# Patient Record
Sex: Female | Born: 1990 | Race: White | Hispanic: No | Marital: Married
Health system: Southern US, Community
[De-identification: ages and names within clinical notes are randomized; demographics above are authoritative.]

---

## 2012-04-05 ENCOUNTER — Emergency Department: Payer: Self-pay | Admitting: *Deleted

## 2012-04-05 LAB — URINALYSIS, COMPLETE
Bilirubin,UR: NEGATIVE
Blood: NEGATIVE
Hyaline Cast: 2
Nitrite: NEGATIVE
Protein: NEGATIVE

## 2012-04-06 LAB — HCG, QUANTITATIVE, PREGNANCY: Beta Hcg, Quant.: 30731 m[IU]/mL — ABNORMAL HIGH

## 2012-07-07 ENCOUNTER — Encounter: Payer: Self-pay | Admitting: Maternal & Fetal Medicine

## 2012-08-07 ENCOUNTER — Observation Stay: Payer: Self-pay | Admitting: Obstetrics and Gynecology

## 2012-08-07 LAB — URINALYSIS, COMPLETE
Blood: NEGATIVE
Glucose,UR: NEGATIVE mg/dL (ref 0–75)
Ketone: NEGATIVE
Nitrite: NEGATIVE
Ph: 7 (ref 4.5–8.0)
WBC UR: 12 /HPF (ref 0–5)

## 2012-09-01 ENCOUNTER — Encounter: Payer: Self-pay | Admitting: Maternal and Fetal Medicine

## 2012-09-24 ENCOUNTER — Observation Stay: Payer: Self-pay

## 2012-09-24 LAB — URINALYSIS, COMPLETE
Glucose,UR: NEGATIVE mg/dL (ref 0–75)
Ketone: NEGATIVE
Protein: 30
RBC,UR: 9 /HPF (ref 0–5)
Specific Gravity: 1.018 (ref 1.003–1.030)
Squamous Epithelial: 29
WBC UR: 252 /HPF (ref 0–5)

## 2012-10-23 ENCOUNTER — Encounter: Payer: Self-pay | Admitting: Maternal & Fetal Medicine

## 2014-02-04 IMAGING — US US OB < 14 WEEKS
1 series · 14 of 28 positions shown · non-contrast
Comparison: none

REASON FOR EXAM: Abdominal pain
COMMENTS:   May transport without cardiac monitor

[Series 1: us ob < 14 weeks · 0.18mm/px · 14 of 38 slices shown]
[im 2/38]
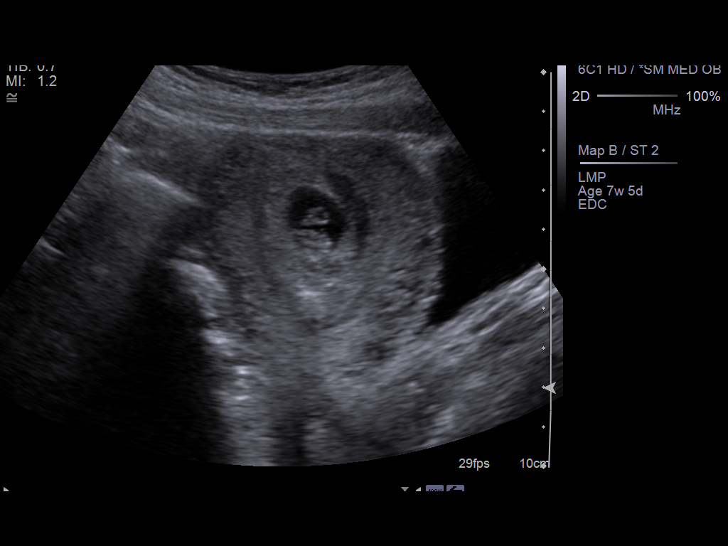
[im 5/38]
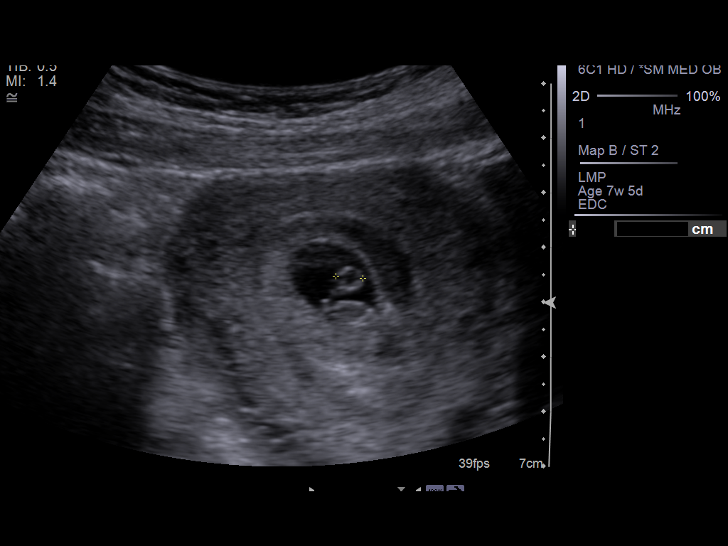
[im 7/38]
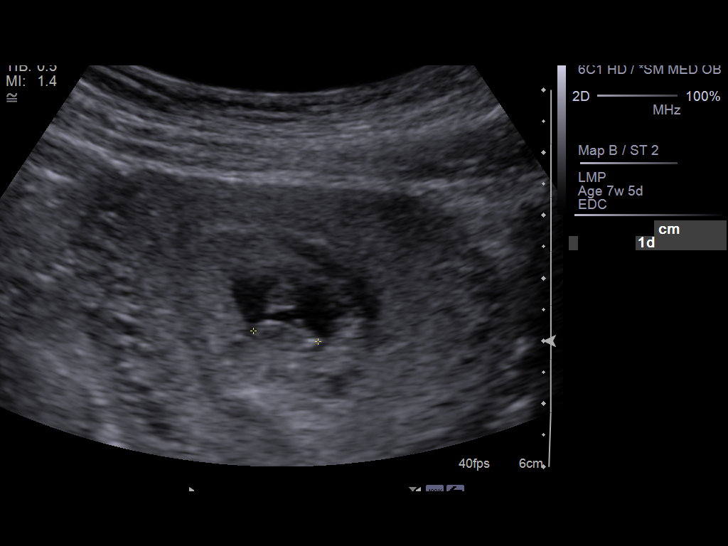
[im 10/38]
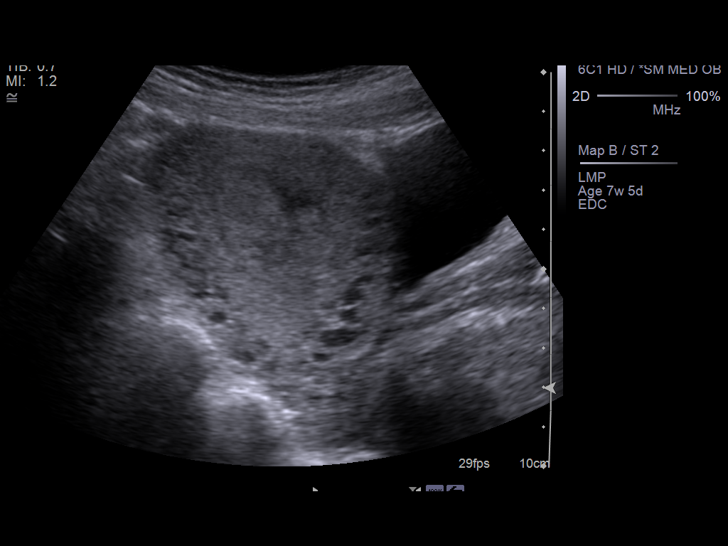
[im 13/38]
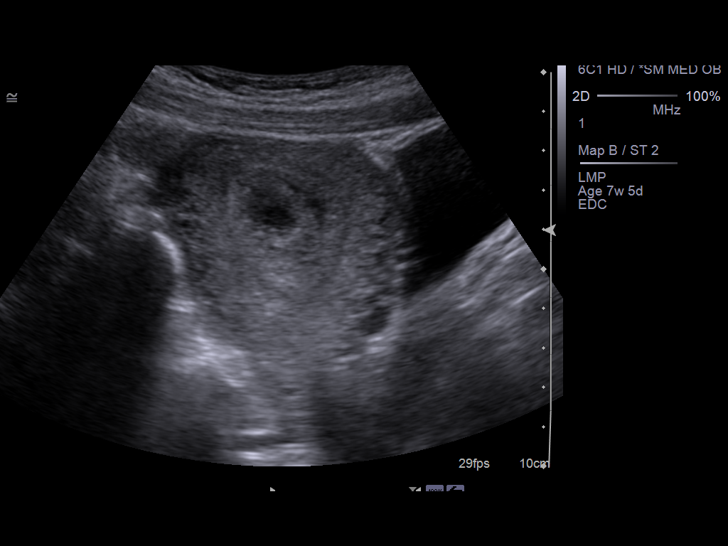
[im 16/38]
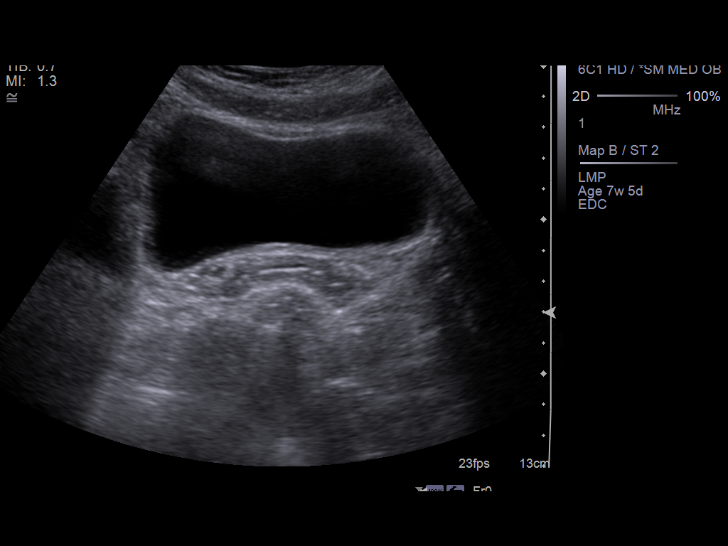
[im 18/38]
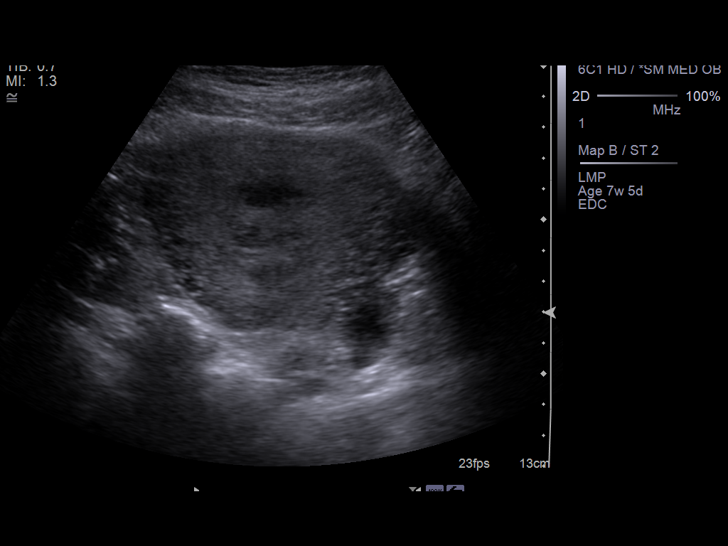
[im 21/38]
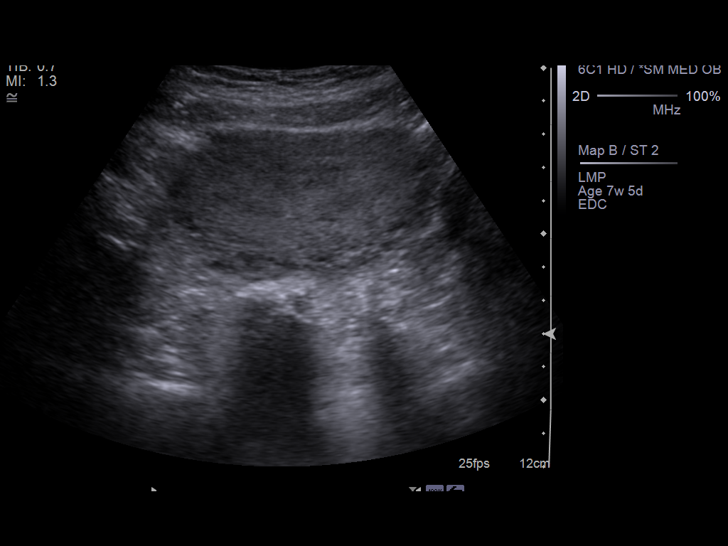
[im 24/38]
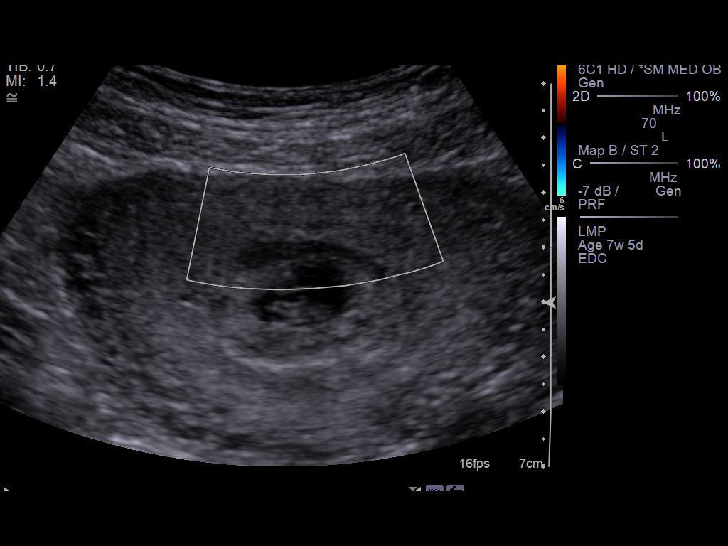
[im 27/38]
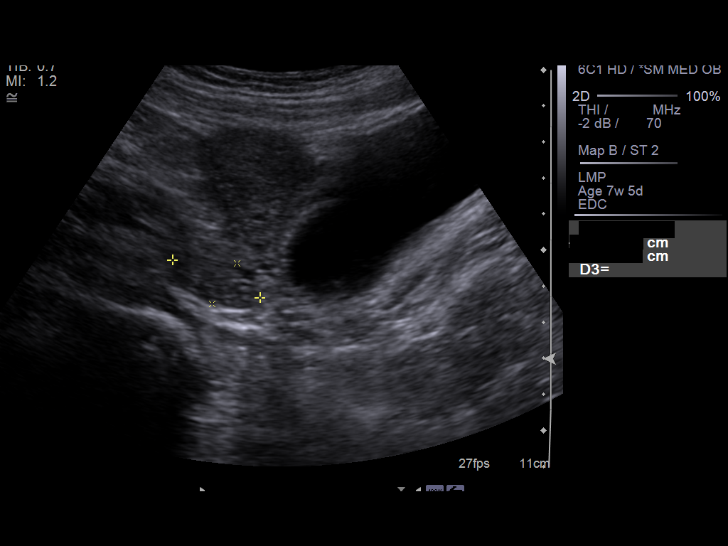
[im 29/38]
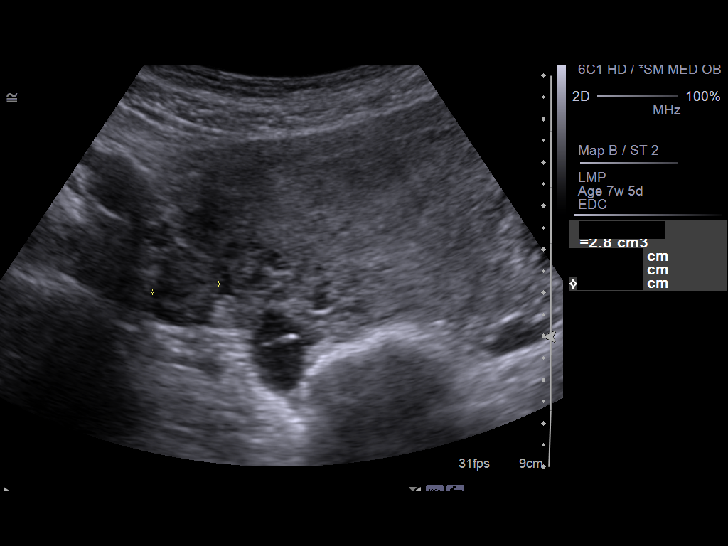
[im 32/38]
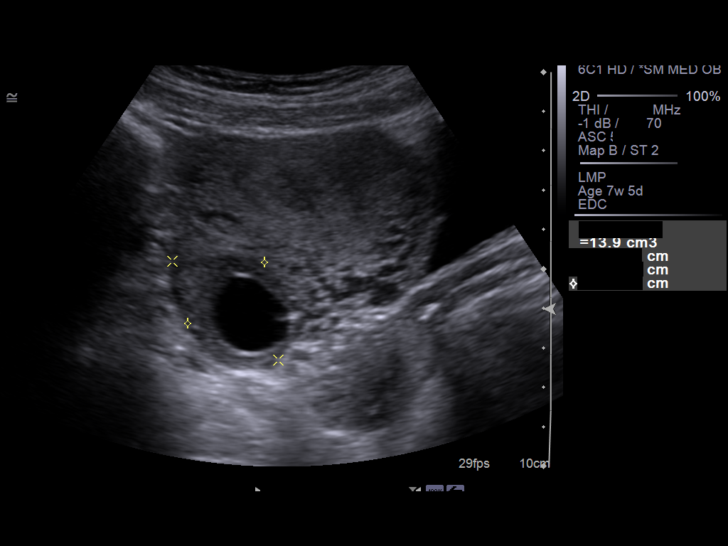
[im 35/38]
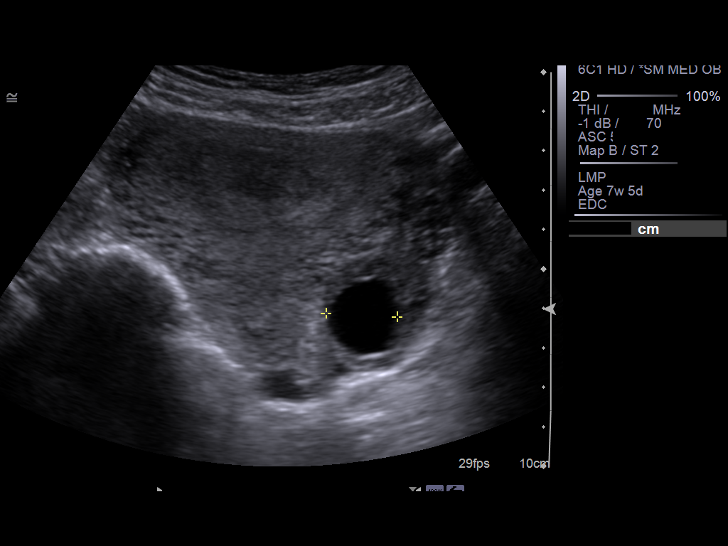
[im 38/38]
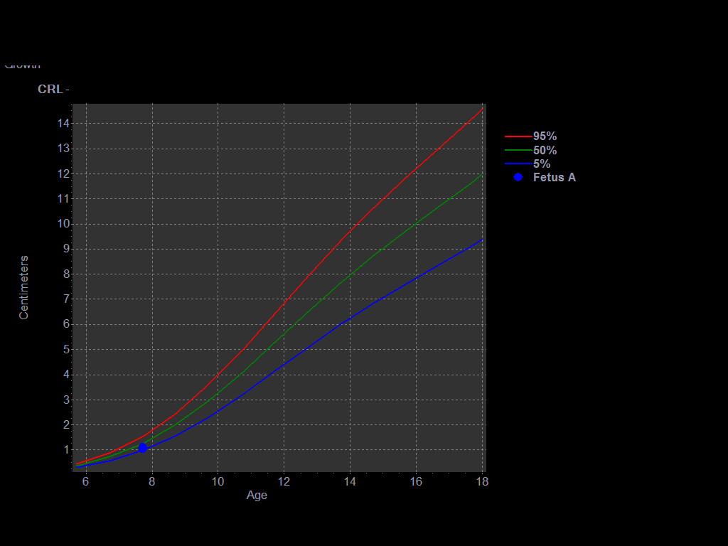

[14 of 28 positions shown; findings below may reference images not displayed]

PROCEDURE:     US  - US OB LESS THAN 14 WEEKS  - April 06, 2012  [DATE]

RESULT:     There is a gravid uterus present. The crown-rump length of the
fetus measures 1.05 cm corresponding to a 7 week one day gestation. A yolk
sac measuring 5 mm in diameter is present. A cardiac rate of 153 beats per
minute is demonstrated. There is a very small subchorionic correction there
is a small subchorionic hemorrhage measuring 2.1 x 2.9 x 0.8 cm.

The maternal ovaries exhibit normal echotexture. The right ovary measures
2.6 x 1.5 x 1.3 cm. The left ovary measures 2.7 x 2.9 x 2.5 cm. The left
ovary does contain a 2.2 cm diameter cyst. Vascularity of the ovaries is
normal.
IMPRESSION: 1. There is an early IUP with estimated gestational age of 7 weeks one day.
A fetal cardiac rate of 153 beats permitted is demonstrated.
2. There is a subchorionic hemorrhage measuring 2.7 x 2.9 x 0.8 cm in
greatest dimension.
3. There is a 2.2 cm diameter left ovarian cyst.

A preliminary report was sent to the [HOSPITAL] the conclusion
of the study.

[REDACTED]

## 2014-05-07 IMAGING — US US OB DETAIL+14 WK - NRPT [facility]
1 series · 14 of 28 positions shown · non-contrast
Comparison: none

[Series 1: us ob detail+14 wk - [facility]pt [facility] · 0.26mm/px · 14 of 123 slices shown]
[im 5/123]
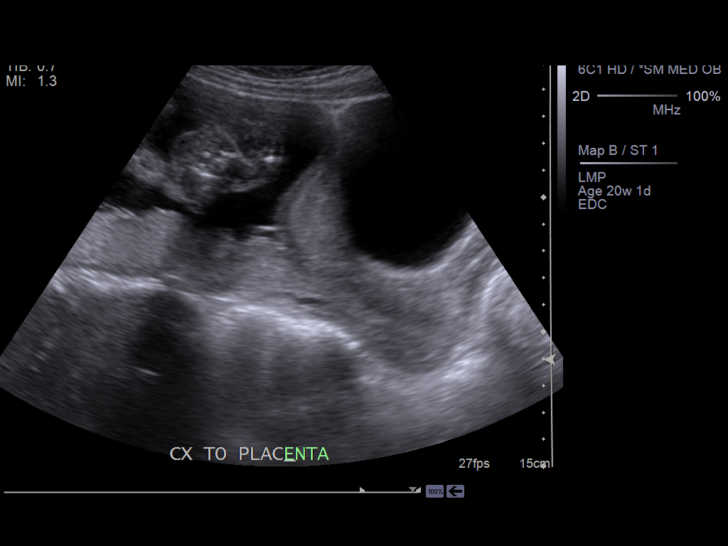
[im 14/123]
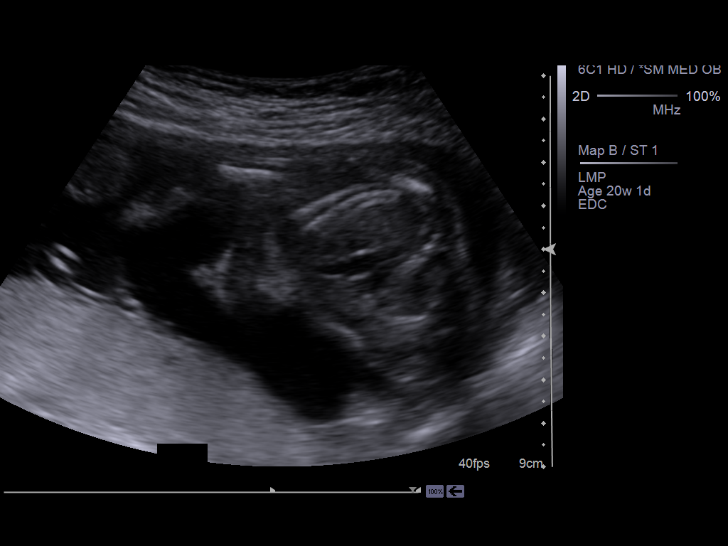
[im 23/123]
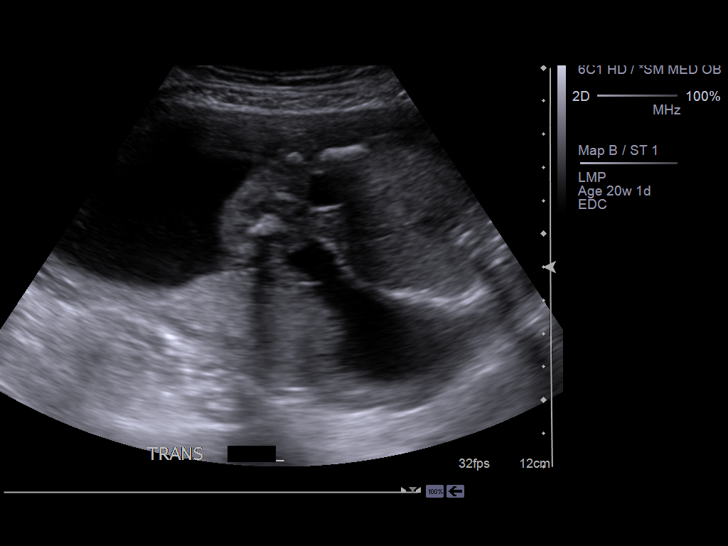
[im 32/123]
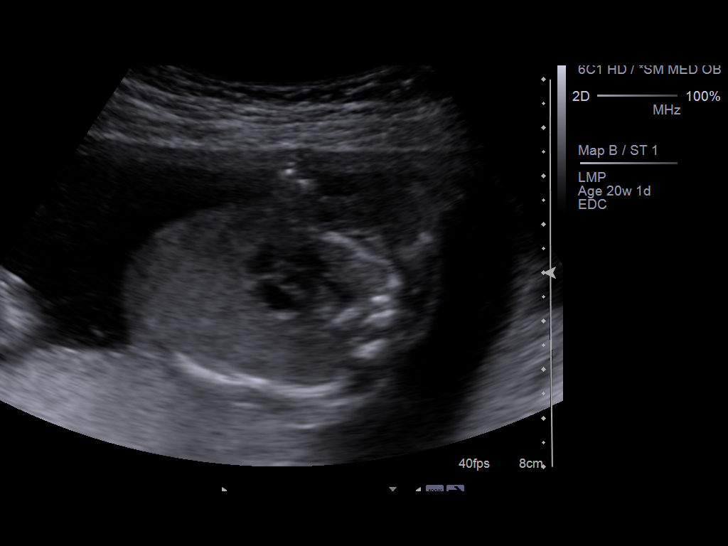
[im 41/123]
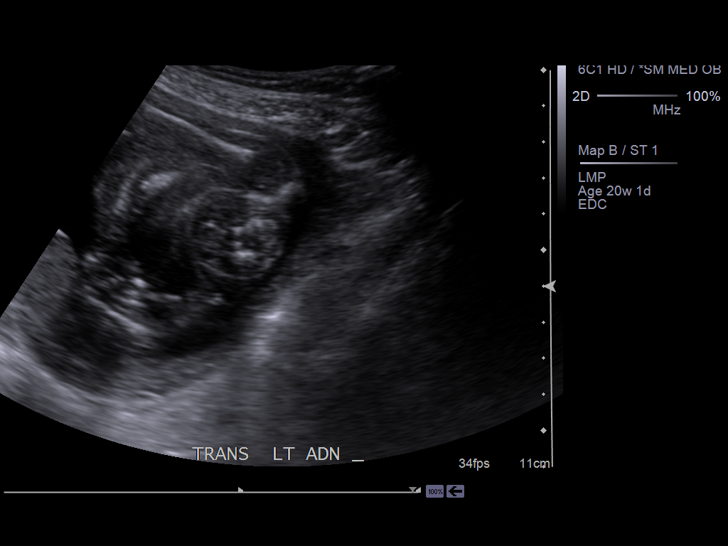
[im 50/123]
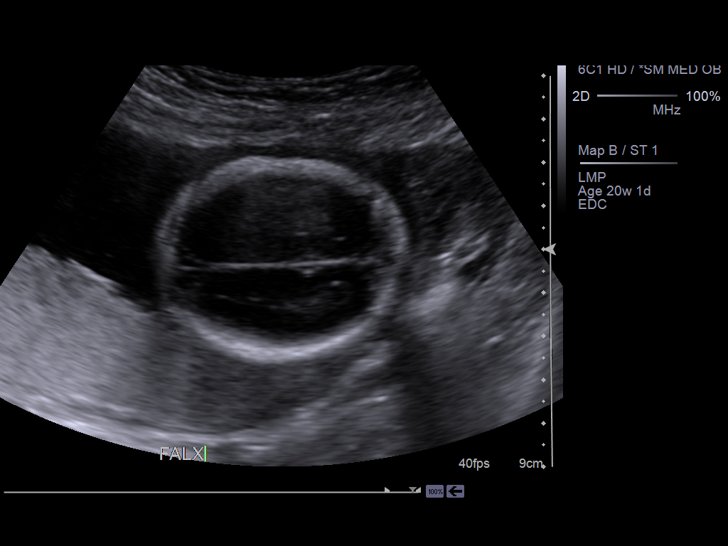
[im 59/123]
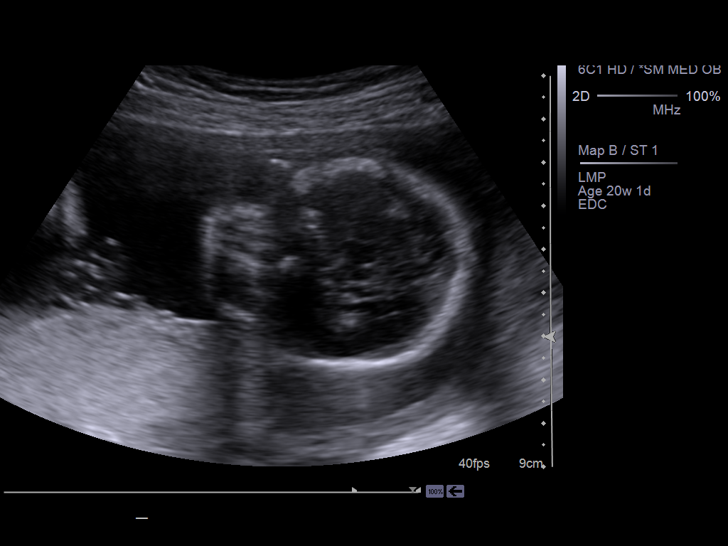
[im 68/123]
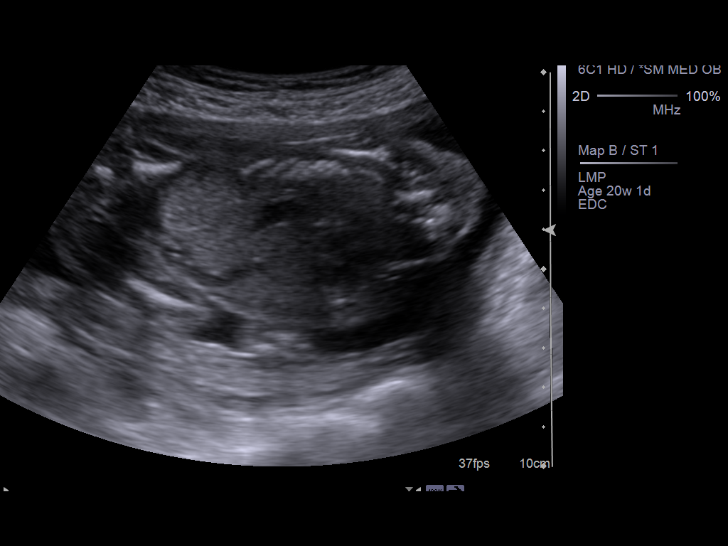
[im 77/123]
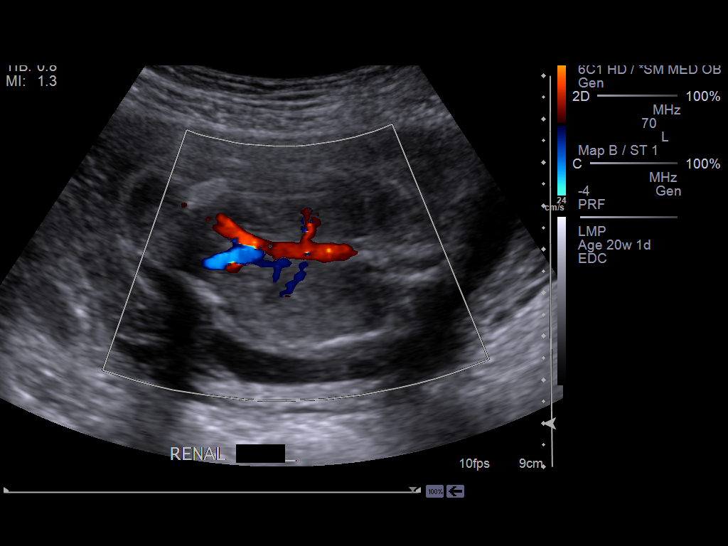
[im 86/123]
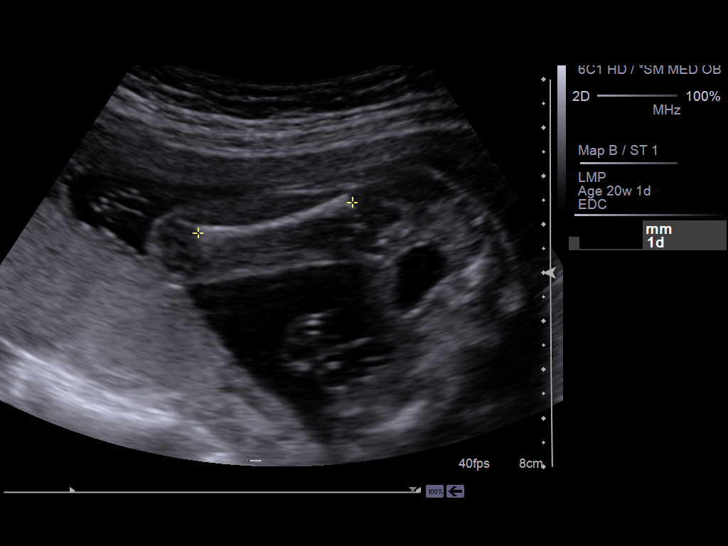
[im 95/123]
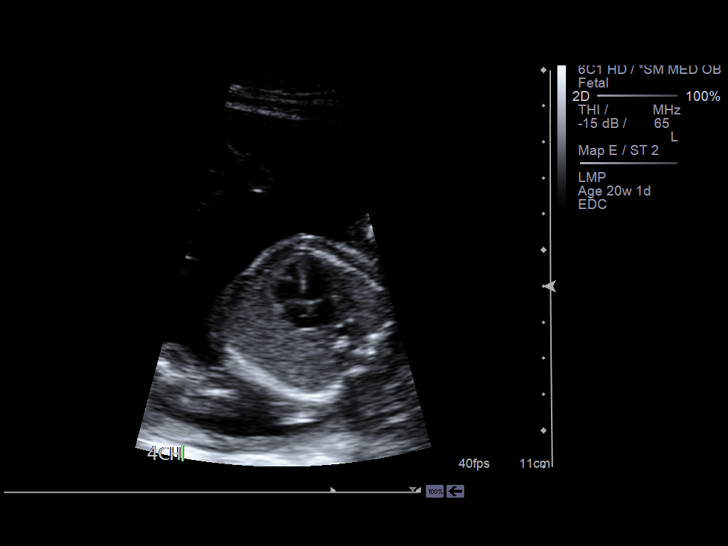
[im 104/123]
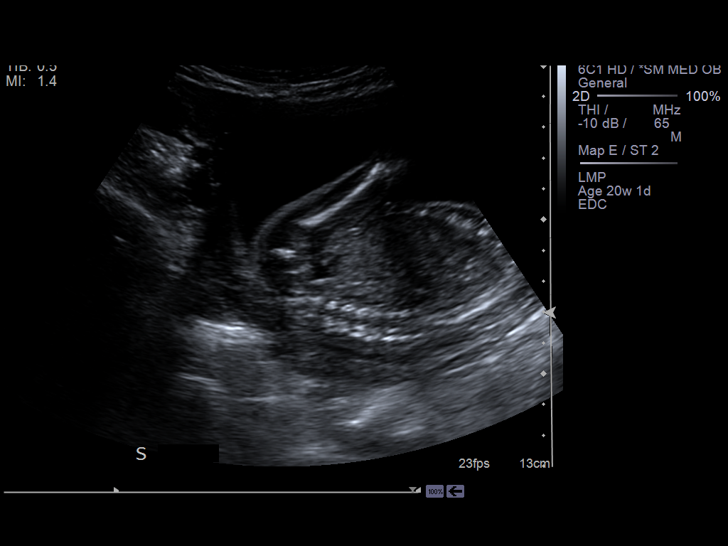
[im 113/123]
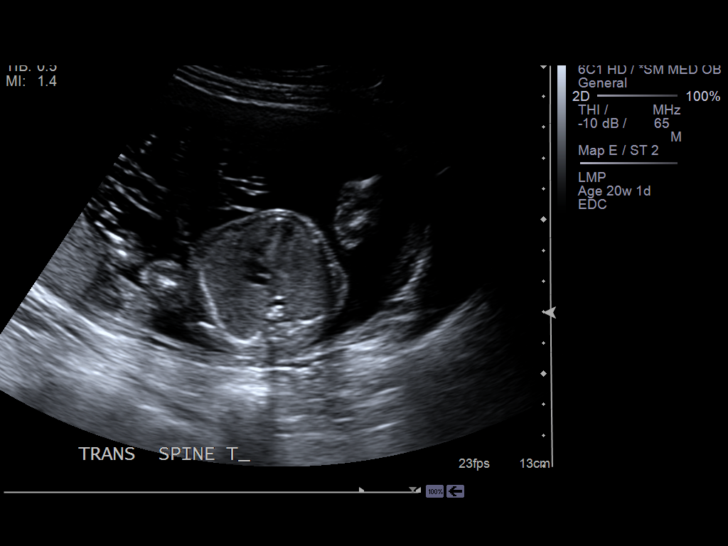
[im 123/123]
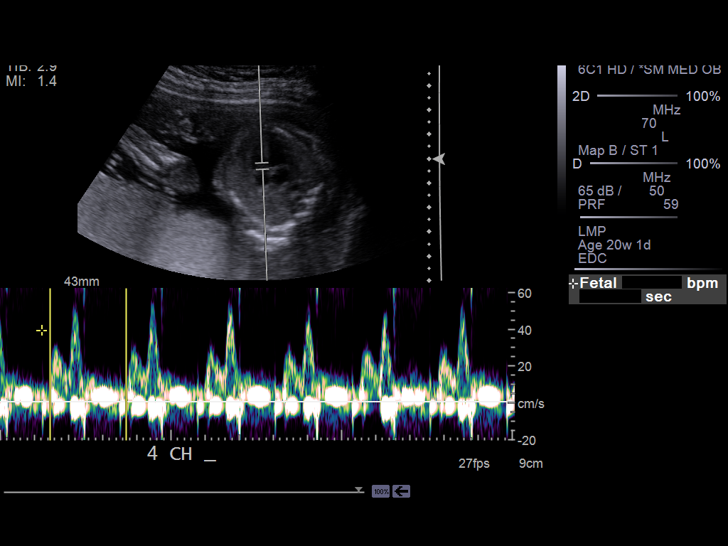

[14 of 28 positions shown; findings below may reference images not displayed]

IMAGES IMPORTED FROM THE [employer] WORKFLOW SYSTEM
NO DICTATION FOR STUDY

## 2014-08-23 IMAGING — US US OB FOLLOW-UP - [facility]
1 series · 14 of 28 positions shown · non-contrast
Comparison: none

[Series 1: us ob follow-up - [facility] · 0.26mm/px · 14 of 57 slices shown]
[im 3/57]
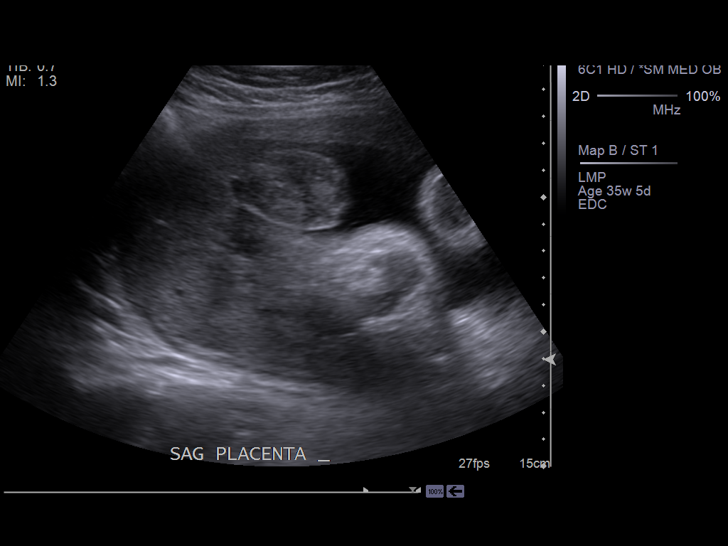
[im 7/57]
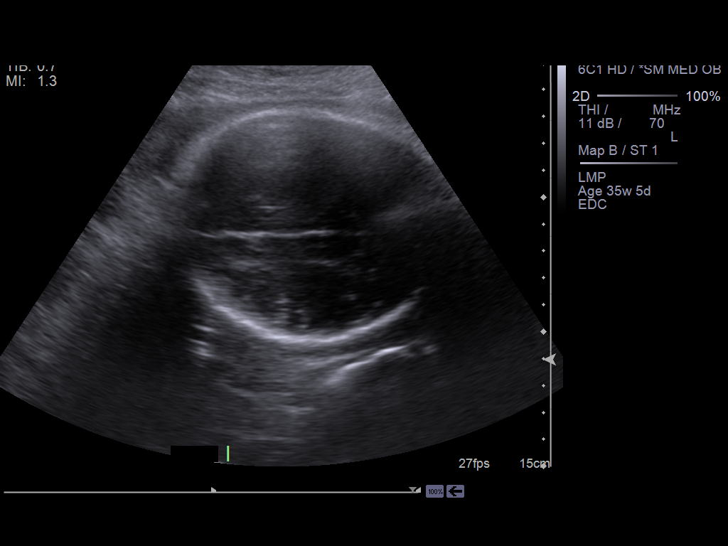
[im 11/57]
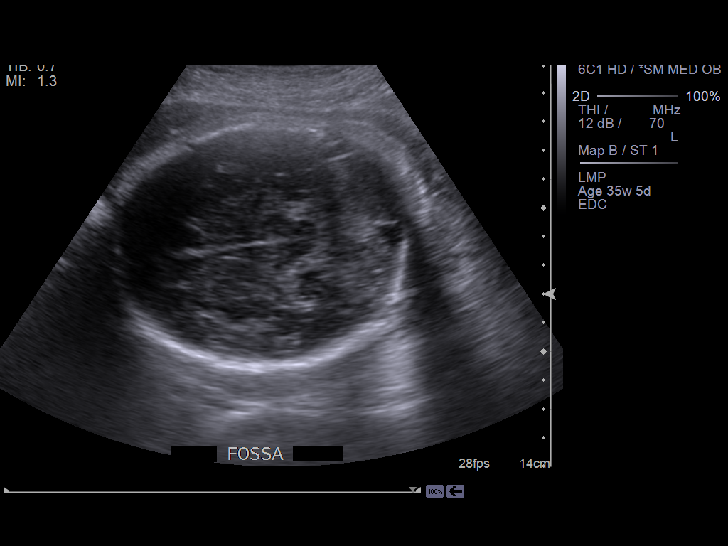
[im 15/57]
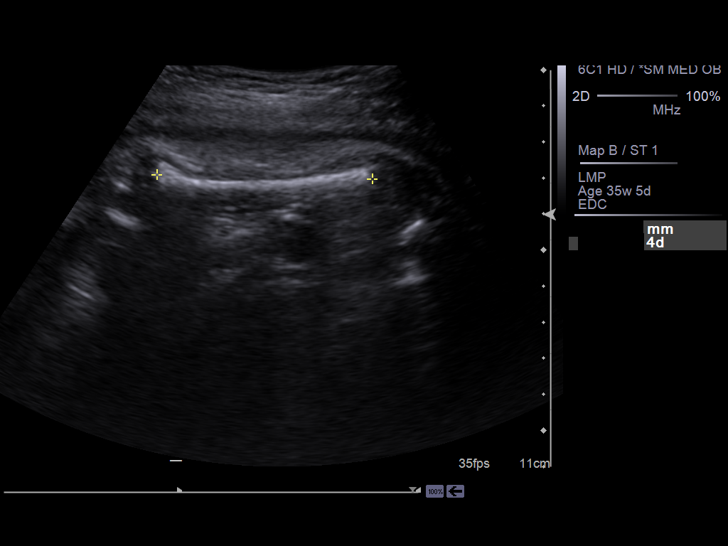
[im 19/57]
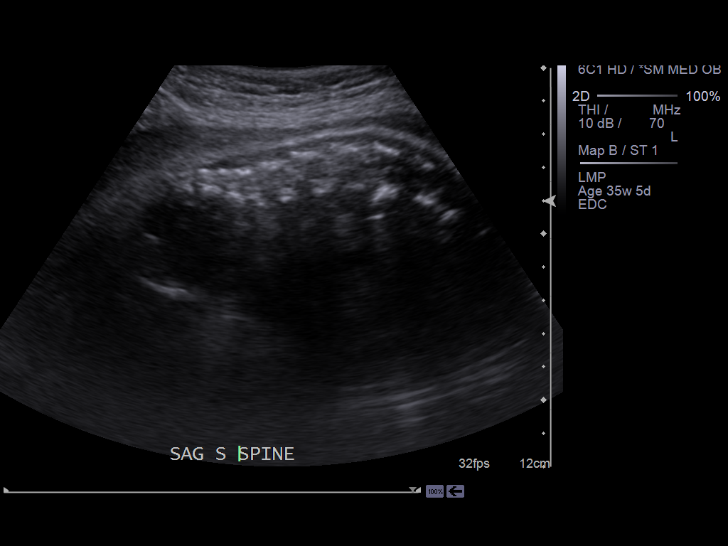
[im 23/57]
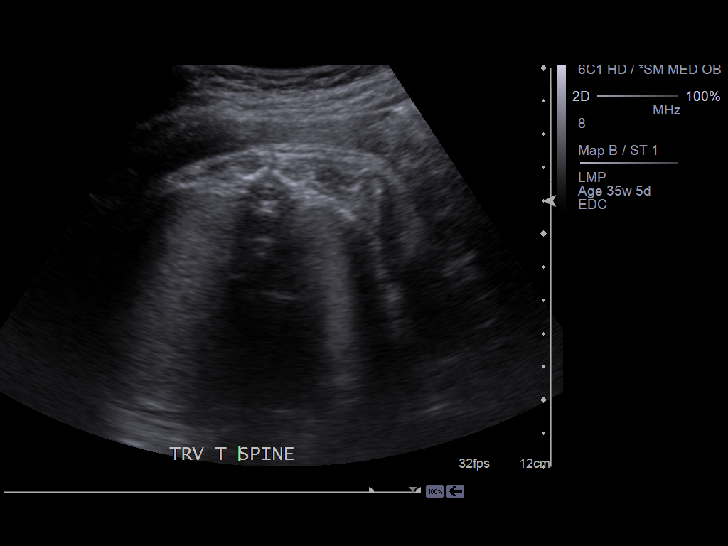
[im 27/57]
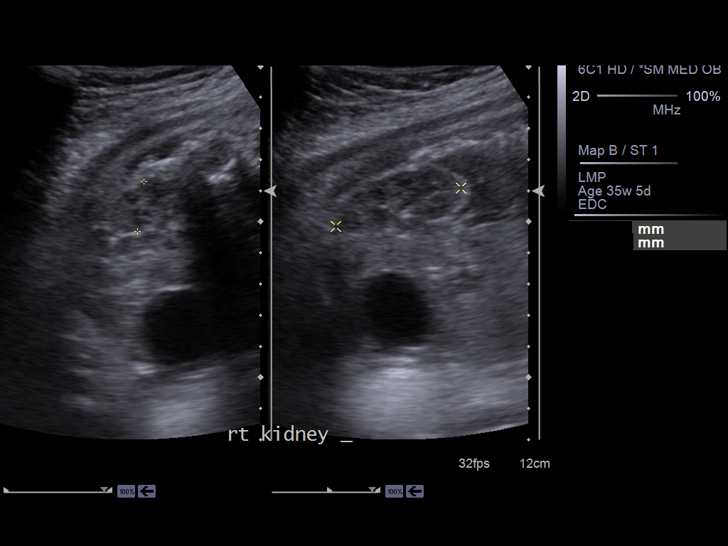
[im 32/57]
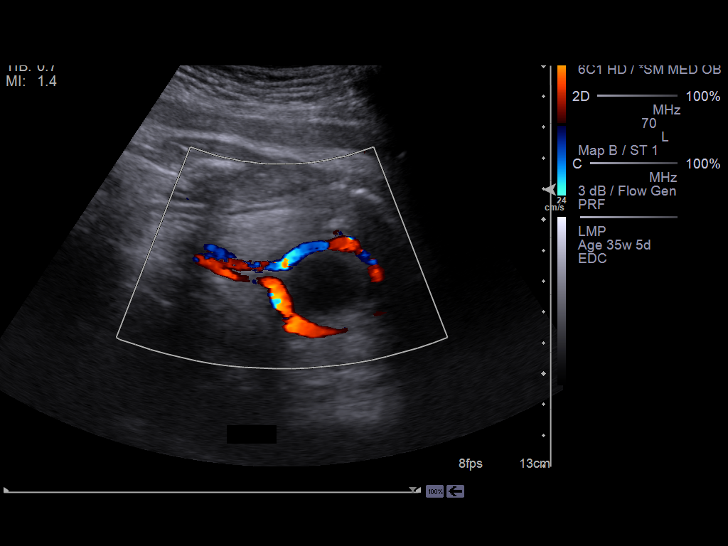
[im 36/57]
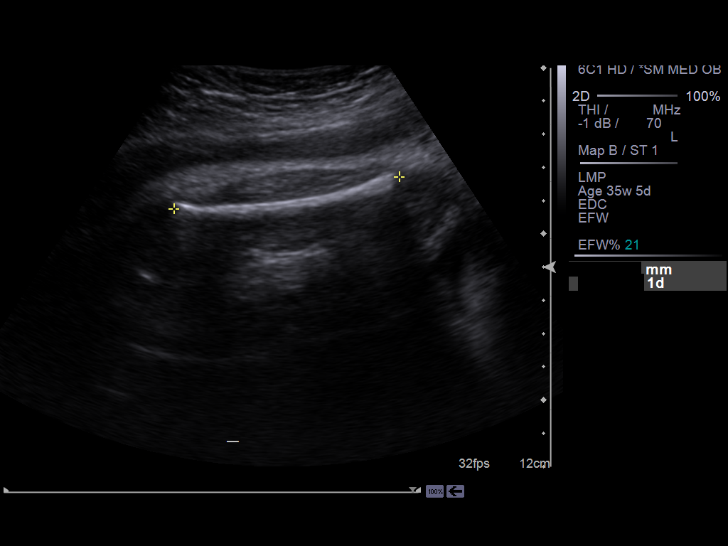
[im 40/57]
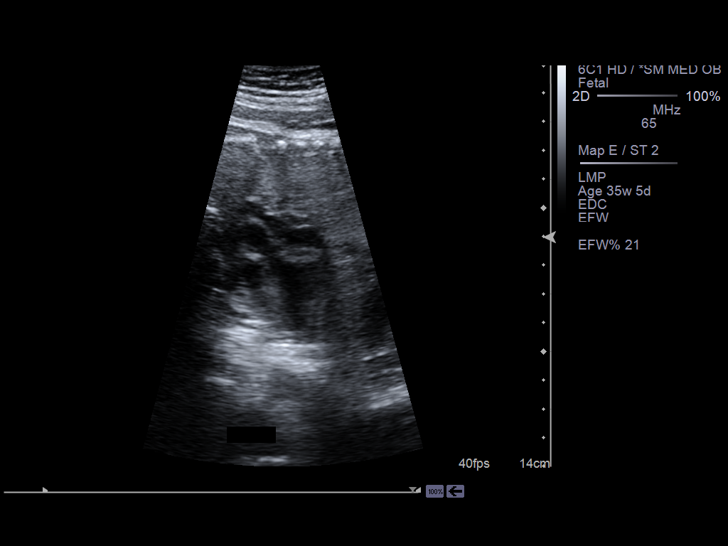
[im 44/57]
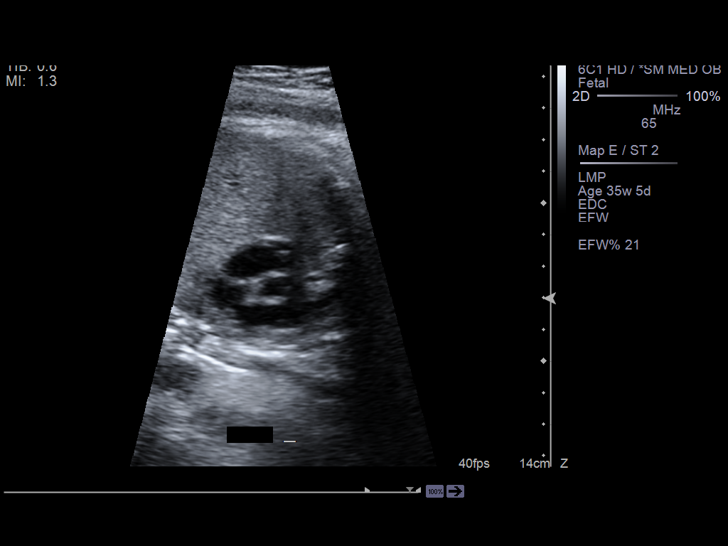
[im 48/57]
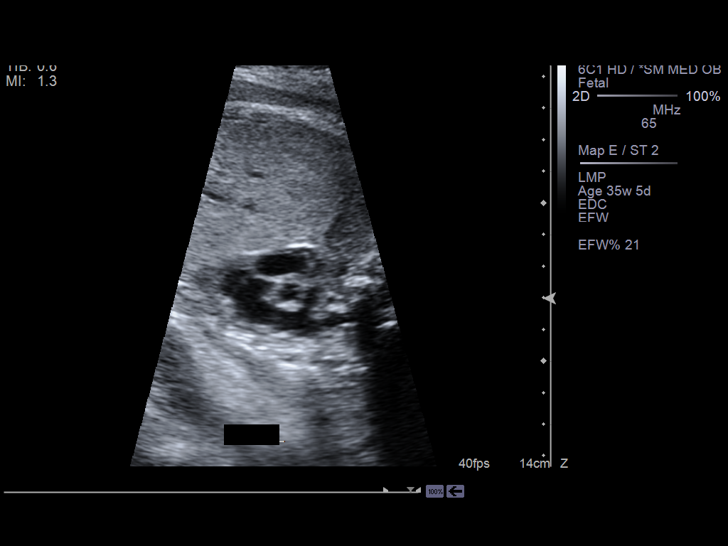
[im 52/57]
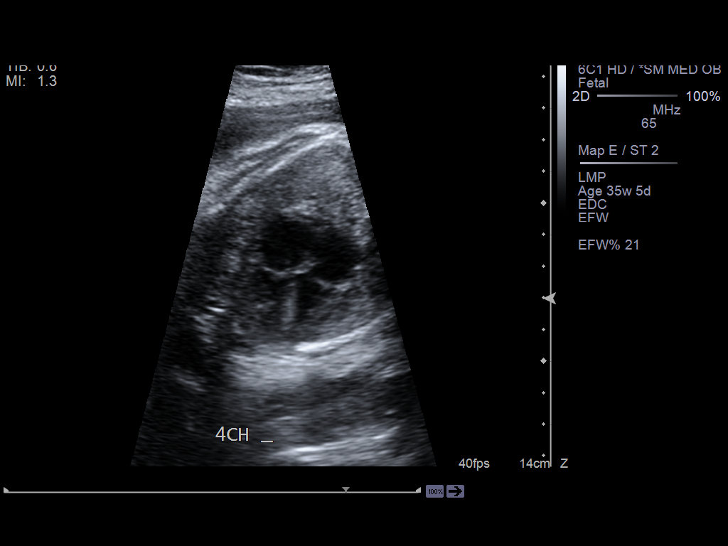
[im 57/57]
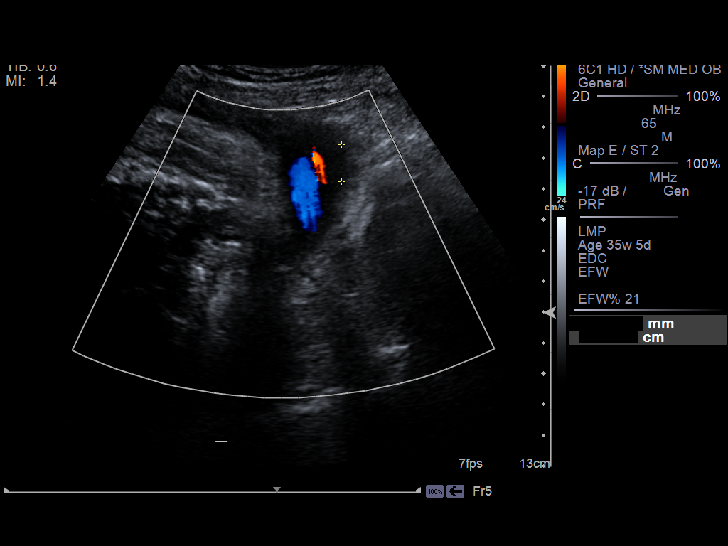

[14 of 28 positions shown; findings below may reference images not displayed]

IMAGES IMPORTED FROM THE [employer] WORKFLOW SYSTEM
NO DICTATION FOR STUDY
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
